# Patient Record
Sex: Male | Born: 2005 | Race: White | Hispanic: No | Marital: Single | Smoking: Never smoker
Health system: Southern US, Community
[De-identification: ages and names within clinical notes are randomized; demographics above are authoritative.]

---
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
## 2023-05-03 ENCOUNTER — Ambulatory Visit: Payer: BC Managed Care – PPO | Admitting: Nurse Practitioner
